# Patient Record
Sex: Female | Born: 1960 | Race: White | Hispanic: No | Smoking: Former smoker
Health system: Southern US, Community
[De-identification: ages and names within clinical notes are randomized; demographics above are authoritative.]

## PROBLEM LIST (undated history)

## (undated) DIAGNOSIS — F419 Anxiety disorder, unspecified: Secondary | ICD-10-CM

## (undated) HISTORY — PX: FACIAL COSMETIC SURGERY: SHX629

## (undated) HISTORY — PX: TONSILLECTOMY: SUR1361

---

## 2006-12-08 ENCOUNTER — Emergency Department: Payer: Self-pay | Admitting: Emergency Medicine

## 2008-04-13 ENCOUNTER — Emergency Department (HOSPITAL_COMMUNITY): Admission: EM | Admit: 2008-04-13 | Discharge: 2008-04-13 | Payer: Self-pay | Admitting: Emergency Medicine

## 2009-06-07 ENCOUNTER — Emergency Department: Payer: Self-pay | Admitting: Emergency Medicine

## 2009-08-08 ENCOUNTER — Emergency Department (HOSPITAL_COMMUNITY): Admission: EM | Admit: 2009-08-08 | Discharge: 2009-08-08 | Payer: Self-pay | Admitting: Emergency Medicine

## 2010-07-22 LAB — POCT I-STAT, CHEM 8
BUN: 7 mg/dL (ref 6–23)
Calcium, Ion: 1.1 mmol/L — ABNORMAL LOW (ref 1.12–1.32)
Chloride: 107 mEq/L (ref 96–112)
Creatinine, Ser: 0.8 mg/dL (ref 0.4–1.2)
HCT: 42 % (ref 36.0–46.0)
Potassium: 3.7 mEq/L (ref 3.5–5.1)

## 2010-07-22 LAB — CBC
HCT: 39.7 % (ref 36.0–46.0)
Hemoglobin: 13.7 g/dL (ref 12.0–15.0)
MCV: 92.2 fL (ref 78.0–100.0)
RDW: 14.4 % (ref 11.5–15.5)

## 2010-07-22 LAB — DIFFERENTIAL
Eosinophils Relative: 2 % (ref 0–5)
Lymphocytes Relative: 43 % (ref 12–46)
Lymphs Abs: 3 10*3/uL (ref 0.7–4.0)
Neutro Abs: 3.3 10*3/uL (ref 1.7–7.7)
Neutrophils Relative %: 47 % (ref 43–77)

## 2010-07-22 LAB — RAPID URINE DRUG SCREEN, HOSP PERFORMED
Amphetamines: NOT DETECTED
Barbiturates: NOT DETECTED
Benzodiazepines: POSITIVE — AB
Cocaine: NOT DETECTED
Tetrahydrocannabinol: POSITIVE — AB

## 2010-07-22 LAB — POCT PREGNANCY, URINE: Preg Test, Ur: NEGATIVE

## 2011-02-05 LAB — URINALYSIS, ROUTINE W REFLEX MICROSCOPIC
Specific Gravity, Urine: 1.024 (ref 1.005–1.030)
Urobilinogen, UA: 1 mg/dL (ref 0.0–1.0)

## 2011-02-05 LAB — URINE MICROSCOPIC-ADD ON

## 2011-02-05 LAB — CBC
HCT: 40.3 % (ref 36.0–46.0)
Hemoglobin: 13.5 g/dL (ref 12.0–15.0)
MCHC: 33.4 g/dL (ref 30.0–36.0)
MCV: 91.9 fL (ref 78.0–100.0)
Platelets: 266 10*3/uL (ref 150–400)
RBC: 4.39 MIL/uL (ref 3.87–5.11)
RDW: 13.7 % (ref 11.5–15.5)
WBC: 8.2 10*3/uL (ref 4.0–10.5)

## 2011-02-05 LAB — DIFFERENTIAL
Basophils Absolute: 0 10*3/uL (ref 0.0–0.1)
Eosinophils Relative: 1 % (ref 0–5)
Neutro Abs: 6.4 10*3/uL (ref 1.7–7.7)

## 2011-02-05 LAB — PREGNANCY, URINE: Preg Test, Ur: NEGATIVE

## 2017-05-06 ENCOUNTER — Emergency Department: Payer: Self-pay

## 2017-05-06 ENCOUNTER — Emergency Department
Admission: EM | Admit: 2017-05-06 | Discharge: 2017-05-06 | Disposition: A | Discharge status: Home / Self Care | Payer: Self-pay | Attending: Emergency Medicine | Admitting: Emergency Medicine

## 2017-05-06 DIAGNOSIS — W010XXA Fall on same level from slipping, tripping and stumbling without subsequent striking against object, initial encounter: Secondary | ICD-10-CM | POA: Insufficient documentation

## 2017-05-06 DIAGNOSIS — Y92009 Unspecified place in unspecified non-institutional (private) residence as the place of occurrence of the external cause: Secondary | ICD-10-CM | POA: Insufficient documentation

## 2017-05-06 DIAGNOSIS — Y939 Activity, unspecified: Secondary | ICD-10-CM | POA: Insufficient documentation

## 2017-05-06 DIAGNOSIS — S52532A Colles' fracture of left radius, initial encounter for closed fracture: Secondary | ICD-10-CM | POA: Insufficient documentation

## 2017-05-06 DIAGNOSIS — Z87891 Personal history of nicotine dependence: Secondary | ICD-10-CM | POA: Insufficient documentation

## 2017-05-06 DIAGNOSIS — S52602A Unspecified fracture of lower end of left ulna, initial encounter for closed fracture: Secondary | ICD-10-CM | POA: Insufficient documentation

## 2017-05-06 DIAGNOSIS — Y999 Unspecified external cause status: Secondary | ICD-10-CM | POA: Insufficient documentation

## 2017-05-06 MED ORDER — BUPIVACAINE HCL (PF) 0.25 % IJ SOLN
INTRAMUSCULAR | Status: AC
  Filled 2017-05-06: qty 30

## 2017-05-06 MED ORDER — HYDROCODONE-ACETAMINOPHEN 5-325 MG PO TABS: 1.0000 | ORAL_TABLET | Freq: Four times a day (QID) | ORAL | 0 refills | Status: DC | PRN

## 2017-05-06 MED ORDER — HYDROMORPHONE HCL 1 MG/ML IJ SOLN
INTRAMUSCULAR | Status: AC
  Filled 2017-05-06: qty 2

## 2017-05-06 MED ORDER — BUPIVACAINE HCL 0.5 % IJ SOLN
50.0000 mL | Freq: Once | INTRAMUSCULAR | Status: DC
  Filled 2017-05-06: qty 50

## 2017-05-06 MED ORDER — HYDROMORPHONE HCL 1 MG/ML IJ SOLN
2.0000 mg | Freq: Once | INTRAMUSCULAR | Status: AC
  Administered 2017-05-06: 2 mg via INTRAVENOUS

## 2017-05-06 NOTE — Discharge Instructions (Addendum)
Please keep your splint on at all times and follow-up with the orthopedic surgeon next week for reevaluation.  Return to the emergency department sooner for any concerns such as worsening pain, numbness, weakness, or for any other issues whatsoever.  It was a pleasure to take care of you today, and thank you for coming to our emergency department.  If you have any questions or concerns before leaving please ask the nurse to grab me and I'm more than happy to go through your aftercare instructions again.  If you were prescribed any opioid pain medication today such as Norco, Vicodin, Percocet, morphine, hydrocodone, or oxycodone please make sure you do not drive when you are taking this medication as it can alter your ability to drive safely.  If you have any concerns once you are home that you are not improving or are in fact getting worse before you can make it to your follow-up appointment, please do not hesitate to call 911 and come back for further evaluation.  Merrily BrittleNeil Jaelle Campanile, MD  Results for orders placed or performed during the hospital encounter of 08/08/09  Ethanol  Result Value Ref Range   Alcohol, Ethyl (B) (H) 0 - 10 mg/dL    191261        LOWEST DETECTABLE LIMIT FOR SERUM ALCOHOL IS 5 mg/dL FOR MEDICAL PURPOSES ONLY  CBC  Result Value Ref Range   WBC 7.0 4.0 - 10.5 K/uL   RBC 4.31 3.87 - 5.11 MIL/uL   Hemoglobin 13.7 12.0 - 15.0 g/dL   HCT 47.839.7 29.536.0 - 62.146.0 %   MCV 92.2 78.0 - 100.0 fL   MCHC 34.4 30.0 - 36.0 g/dL   RDW 30.814.4 65.711.5 - 84.615.5 %   Platelets 220 150 - 400 K/uL  Differential  Result Value Ref Range   Neutrophils Relative % 47 43 - 77 %   Neutro Abs 3.3 1.7 - 7.7 K/uL   Lymphocytes Relative 43 12 - 46 %   Lymphs Abs 3.0 0.7 - 4.0 K/uL   Monocytes Relative 8 3 - 12 %   Monocytes Absolute 0.5 0.1 - 1.0 K/uL   Eosinophils Relative 2 0 - 5 %   Eosinophils Absolute 0.2 0.0 - 0.7 K/uL   Basophils Relative 1 0 - 1 %   Basophils Absolute 0.0 0.0 - 0.1 K/uL  Drug screen  panel, emergency  Result Value Ref Range   Opiates NONE DETECTED NONE DETECTED   Cocaine NONE DETECTED NONE DETECTED   Benzodiazepines POSITIVE (A) NONE DETECTED   Amphetamines NONE DETECTED NONE DETECTED   Tetrahydrocannabinol POSITIVE (A) NONE DETECTED   Barbiturates  NONE DETECTED    NONE DETECTED        DRUG SCREEN FOR MEDICAL PURPOSES ONLY.  IF CONFIRMATION IS NEEDED FOR ANY PURPOSE, NOTIFY LAB WITHIN 5 DAYS.        LOWEST DETECTABLE LIMITS FOR URINE DRUG SCREEN Drug Class       Cutoff (ng/mL) Amphetamine      1000 Barbiturate      200 Benzodiazepine   200 Tricyclics       300 Opiates          300 Cocaine          300 THC              50  Pregnancy, urine POC  Result Value Ref Range   Preg Test, Ur      NEGATIVE        THE SENSITIVITY OF  THIS METHODOLOGY IS >24 mIU/mL  I-STAT, chem 8  Result Value Ref Range   Sodium 144 135 - 145 mEq/L   Potassium 3.7 3.5 - 5.1 mEq/L   Chloride 107 96 - 112 mEq/L   BUN 7 6 - 23 mg/dL   Creatinine, Ser 0.8 0.4 - 1.2 mg/dL   Glucose, Bld 96 70 - 99 mg/dL   Calcium, Ion 8.29 (L) 1.12 - 1.32 mmol/L   TCO2 23 0 - 100 mmol/L   Hemoglobin 14.3 12.0 - 15.0 g/dL   HCT 56.2 13.0 - 86.5 %  Sample to Blood Bank  Result Value Ref Range   Blood Bank Specimen SAMPLE AVAILABLE FOR TESTING    Sample Expiration 08/09/2009    Dg Wrist Complete Left  Result Date: 05/06/2017 CLINICAL DATA:  Mechanical fall 30 minutes ago. Left wrist injury and pain. Obvious deformity. EXAM: LEFT WRIST - COMPLETE 3+ VIEW COMPARISON:  None. FINDINGS: Transverse comminuted fractures of the distal left radial metaphysis. Fracture lines extend to the radiocarpal and radioulnar joint. There is dorsal displacement and angulation of the distal fracture fragments. Nondisplaced ulnar styloid process fracture. Degenerative changes in the wrist. Soft tissue swelling. IMPRESSION: Comminuted fractures of the distal left radial metaphysis. Fracture lines extending to the radiocarpal  and radioulnar joints. Dorsal displacement and angulation of distal fracture fragments. Ulnar styloid process fracture. Electronically Signed   By: Burman Nieves M.D.   On: 05/06/2017 02:31

## 2017-05-06 NOTE — ED Triage Notes (Signed)
Patient reports mechanical fall 30 minutes ago resulting in left wrist injury/pain. Patient reports she cannot lower arm to due increased wrist pain with lowered elevation.

## 2017-05-06 NOTE — ED Notes (Signed)
Pt fell at home and broke her wrist

## 2017-05-06 NOTE — ED Provider Notes (Signed)
[facility] Emergency Department Provider Note  ____________________________________________   First MD Initiated Contact with Patient 05/06/17 0225     (approximate)  I have reviewed the triage vital signs and the nursing notes.   HISTORY  Chief Complaint Wrist Injury    HPI Shannon Summers is a 57 y.o. female who self presents the emergency department with sudden onset severe left wrist pain that began when she slipped on dog urine while getting up to go to the bathroom and falling on her outstretched hand.  She did not hit her head.  She denies numbness or weakness.  The pain is worse with movement improved with rest.  History reviewed. No pertinent past medical history.  There are no active problems to display for this patient.   Past Surgical History:  Procedure Laterality Date  . TONSILLECTOMY      Prior to Admission medications   Medication Sig Start Date End Date Taking? Authorizing Provider  HYDROcodone-acetaminophen (NORCO) 5-325 MG tablet Take 1 tablet by mouth every 6 (six) hours as needed for up to 15 doses for severe pain. 05/06/17   Merrily Brittleifenbark, Kaydon Creedon, MD    Allergies Patient has no known allergies.  No family history on file.  Social History Social History   Tobacco Use  . Smoking status: Former Games developermoker  . Smokeless tobacco: Never Used  Substance Use Topics  . Alcohol use: Yes  . Drug use: No    Review of Systems Constitutional: No fever/chills ENT: No sore throat. Cardiovascular: Denies chest pain. Respiratory: Denies shortness of breath. Gastrointestinal: No abdominal pain.  No nausea, no vomiting.  No diarrhea.  No constipation. Musculoskeletal: Negative for back pain. Neurological: Negative for headaches   ____________________________________________   PHYSICAL EXAM:  VITAL SIGNS: ED Triage Vitals  Enc Vitals Group     BP 05/06/17 0153 134/67     Pulse Rate 05/06/17 0153 (!) 105     Resp 05/06/17 0153 20    Temp 05/06/17 0153 (!) 97.5 F (36.4 C)     Temp src --      SpO2 05/06/17 0153 96 %     Weight 05/06/17 0154 225 lb (102.1 kg)     Height --      Head Circumference --      Peak Flow --      Pain Score 05/06/17 0153 10     Pain Loc --      Pain Edu? --      Excl. in GC? --     Constitutional: Alert and oriented x4 appears uncomfortable nontoxic no diaphoresis speaks full clear sentences Head: Atraumatic. Nose: No congestion/rhinnorhea. Mouth/Throat: No trismus Neck: No stridor.   Cardiovascular: Regular rate and rhythm Respiratory: Normal respiratory effort.  No retractions. MSK: Exquisite tenderness over distal radius and ulna with dorsal angulation no tenderness over snuffbox and no axial load discomfort Sensation intact to light touch over first dorsal webspace, distal index finger, distal small finger Can flex and oppose  thumb, cross 2 on 3, and extend wrist 2+ radial pulse and less than 2 second capillary refill Skin is closed Compartments are soft  Neurologic:  Normal speech and language. No gross focal neurologic deficits are appreciated.  Skin:  Skin is warm, dry and intact. No rash noted.    ____________________________________________  LABS (all labs ordered are listed, but only abnormal results are displayed)  Labs Reviewed - No data to display   __________________________________________  EKG  ____________________________________________  RADIOLOGY  X-ray reviewed by me shows dorsally angulated distal radius fracture ____________________________________________   DIFFERENTIAL includes but not limited to  Colles' fracture, Barton's fracture, Smith fracture, wrist dislocation, wrist sprain   PROCEDURES  Procedure(s) performed: yes  SPLINT APPLICATION Date/Time: 4:48 AM Authorized by: Merrily Brittle Consent: Verbal consent obtained. Risks and benefits: risks, benefits and alternatives were discussed Consent given by: patient Splint  applied by: ER technician Location details: Left forearm Splint type: Sugar tong Supplies used: Ortho-Glass Post-procedure: The splinted body part was neurovascularly unchanged following the procedure. Patient tolerance: Patient tolerated the procedure well with no immediate complications.     Reduction of fracture Date/Time: 05/06/2017 4:46 AM Performed by: Merrily Brittle, MD Authorized by: Merrily Brittle, MD  Consent: Verbal consent obtained. Risks and benefits: risks, benefits and alternatives were discussed Consent given by: patient Patient understanding: patient states understanding of the procedure being performed Patient consent: the patient's understanding of the procedure matches consent given Procedure consent: procedure consent matches procedure scheduled Patient identity confirmed: verbally with patient and arm band Local anesthesia used: yes Anesthesia: hematoma block  Anesthesia: Local anesthesia used: yes Local Anesthetic: bupivacaine 0.5% without epinephrine  Sedation: Patient sedated: no  Patient tolerance: Patient tolerated the procedure well with no immediate complications  .Nerve Block Date/Time: 05/06/2017 4:46 AM Performed by: Merrily Brittle, MD Authorized by: Merrily Brittle, MD   Consent:    Consent obtained:  Verbal   Consent given by:  Patient   Risks discussed:  Allergic reaction, infection, nerve damage and pain   Alternatives discussed:  Alternative treatment and no treatment Indications:    Indications:  Pain relief Location:    Body area:  Upper extremity   Laterality:  Left Pre-procedure details:    Skin preparation:  Alcohol Skin anesthesia (see MAR for exact dosages):    Skin anesthesia method:  None Procedure details (see MAR for exact dosages):    Block needle gauge:  21 G   Anesthetic injected:  Bupivacaine 0.5% w/o epi   Steroid injected:  None   Additive injected:  None Post-procedure details:    Outcome:  Pain  relieved Comments:     I performed a hematoma block to the patient's left distal radius fracture using a 21-gauge needle and a total of 9 cc of 0.5% bupivacaine without epinephrine.  I got a flashback of blood and then infused the medication.  The patient achieved near complete anesthesia.    Critical Care performed: no  Observation: no ____________________________________________   INITIAL IMPRESSION / ASSESSMENT AND PLAN / ED COURSE  Pertinent labs & imaging results that were available during my care of the patient were reviewed by me and considered in my medical decision making (see chart for details).  The patient arrives with severe sudden onset distal radius pain after a fall on outstretched hand.  X-ray confirms a Colles' fracture.  I verbally consented the patient for hematoma block and achieved significant anesthesia.  I then reduced the fracture with distraction countertraction with successful reduction of the fracture.  She was then splinted and remained neurovascularly intact following.  Her pain is improved.  At this point she is medically stable for outpatient management with follow-up with orthopedic surgery in 1 week as scheduled.  She verbalized understanding agree with plan.      ____________________________________________   FINAL CLINICAL IMPRESSION(S) / ED DIAGNOSES  Final diagnoses:  Closed Colles' fracture of left radius, initial encounter  Closed fracture of distal end of left ulna, unspecified fracture morphology, initial  encounter      NEW MEDICATIONS STARTED DURING THIS VISIT:  This SmartLink is deprecated. Use AVSMEDLIST instead to display the medication list for a patient.   Note:  This document was prepared using Dragon voice recognition software and may include unintentional dictation errors.      Merrily Brittle, MD 05/06/17 [phone]

## 2017-06-07 ENCOUNTER — Other Ambulatory Visit: Payer: Self-pay

## 2017-06-07 ENCOUNTER — Encounter
Admission: RE | Admit: 2017-06-07 | Discharge: 2017-06-07 | Disposition: A | Discharge status: Home / Self Care | Payer: Self-pay | Source: Ambulatory Visit | Attending: Orthopedic Surgery | Admitting: Orthopedic Surgery

## 2017-06-07 HISTORY — DX: Anxiety disorder, unspecified: F41.9

## 2017-06-07 NOTE — Patient Instructions (Signed)
  Your procedure is scheduled on: 06-10-17  Report to Same Day Surgery 2nd floor medical mall [facility](Medical Mall Entrance-take elevator on left to 2nd floor.  Check in with surgery information desk.) To find out your arrival time please call [phone] between 1PM - 3PM on 06-09-17  Remember: Instructions that are not followed completely may result in serious medical risk, up to and including death, or upon the discretion of your surgeon and anesthesiologist your surgery may need to be rescheduled.    _x___ 1. Do not eat food after midnight the night before your procedure. NO GUM OR CANDY AFTER MIDNIGHT.  You may drink clear liquids up to 2 hours before you are scheduled to arrive at the hospital for your procedure.  Do not drink clear liquids within 2 hours of your scheduled arrival to the hospital.  Clear liquids include  --Water or Apple juice without pulp  --Clear carbohydrate beverage such as ClearFast or Gatorade  --Black Coffee or Clear Tea (No milk, no creamers, do not add anything to  the coffee or Tea      __x__ 2. No Alcohol for 24 hours before or after surgery.   __x__3. No Smoking for 24 prior to surgery.   ____  4. Bring all medications with you on the day of surgery if instructed.    __x__ 5. Notify your doctor if there is any change in your medical condition     (cold, fever, infections).     Do not wear jewelry, make-up, hairpins, clips or nail polish.  Do not wear lotions, powders, or perfumes. You may wear deodorant.  Do not shave 48 hours prior to surgery. Men may shave face and neck.  Do not bring valuables to the hospital.    [facility] not [facility] for any belongings or valuables.               Contacts, dentures or bridgework may not be worn into surgery.  Leave your suitcase in the car. After surgery it may be brought to your room.  For patients admitted to the hospital, discharge time is determined by your treatment team.   Patients discharged the day of  surgery will not be allowed to drive home.  You will need someone to drive you home and stay with you the night of your procedure.   _x___ TAKE THE FOLLOWING MEDICATION THE MORNING OF SURGERY WITH A SMALL SIP OF WATER. These include:  1. XANAX  2.  3.  4.  5.  6.  ____Fleets enema or Magnesium Citrate as directed.   ____ Use CHG Soap or sage wipes as directed on instruction sheet   ____ Use inhalers on the day of surgery and bring to hospital day of surgery  ____ Stop Metformin and Janumet 2 days prior to surgery.    ____ Take 1/2 of usual insulin dose the night before surgery and none on the morning surgery.   ____ Follow recommendations from Cardiologist, Pulmonologist or PCP regarding stopping Aspirin, Coumadin, Plavix ,Eliquis, Effient, or Pradaxa, and Pletal.  X____Stop Anti-inflammatories such as Advil, ALEVE, Ibuprofen, Motrin, Naproxen, Naprosyn, Goodies powders, BC POWDERS or aspirin products NOW-OK to take Tylenol    ____ Stop supplements until after surgery.     ____ Bring C-Pap to the hospital.

## 2017-06-09 MED ORDER — CEFAZOLIN SODIUM-DEXTROSE 2-4 GM/100ML-% IV SOLN
2.0000 g | INTRAVENOUS | Status: AC
  Administered 2017-06-10: 2 g via INTRAVENOUS

## 2017-06-09 NOTE — H&P (Signed)
HISTORY & PHYSICAL by Shari Heritage., MD at 06/07/2017 8:30 AM              Chief Complaint:     Chief Complaint  Patient presents with  . Wrist Pain    Left distal radius fracture1/4/19    Reason for Visit: The patient is a 57 y.o. right-hand dominant female who presents today for reevaluation of her left wrist. She fell on her outstretched hand and sustained a left distal radius fracture on 05/06/2017. Initial alignment was acceptable and she was treated with a long arm cast. However, follow-up radiographs at her last evaluation demonstrated significant loss of reduction. She had initially opted to defer any surgery, but subsequently reconsidered.  Medications: CurrentMedications        Current Outpatient Medications  Medication Sig Dispense Refill  . ALPRAZolam (XANAX) 1 MG tablet TAKE 1 TABLET 3 TIMES A DAY IF NEEDED  0   No current facility-administered medications for this visit.       Allergies: No Known Allergies  Past Medical History:     Past Medical History:  Diagnosis Date  . Chicken pox     Past Surgical History:      Past Surgical History:  Procedure Laterality Date  . TONSILLECTOMY      Social History: SocialHistory  Social History        Socioeconomic History  . Marital status: Divorced    Spouse name: Not on file  . Number of children: 2  . Years of education: 75  . Highest education level: Not on file  Social Needs  . Financial resource strain: Not on file  . Food insecurity - worry: Not on file  . Food insecurity - inability: Not on file  . Transportation needs - medical: Not on file  . Transportation needs - non-medical: Not on file  Occupational History  . Occupation: Full-time- Merchandiser, retail  Tobacco Use  . Smoking status: Former Smoker    Packs/day: 0.50    Years: 16.00    Pack years: 8.00    Types: Cigarettes    Last attempt to quit: 11/30/2016    Years since quitting:  0.5  . Smokeless tobacco: Never Used  Substance and Sexual Activity  . Alcohol use: Yes  . Drug use: Never  . Sexual activity: Defer    Partners: Male  Other Topics Concern  . Not on file  Social History Narrative  . Not on file      Family History:      Family History  Problem Relation Age of Onset  . Hodgkin's lymphoma Mother   . Heart disease Father   . No Known Problems Brother     Review of Systems: A comprehensive 14 point ROS was performed, reviewed, and the pertinent orthopaedic findings are documented in the HPI.  Exam BP 132/74   Ht 160 cm (5\' 3" )   Wt 89.5 kg (197 lb 6.4 oz)   BMI 34.97 kg/m   General:  Well-developed, well-nourished female seen in no acute distress.   HEENT:  Atraumatic, normocephalic.  Pupils are equal and reactive to light.  Extraocular motion is intact. Sclera are clear.  Oropharynx is clear with moist mucosa.  Neck: Good range of motion.  No tenderness to palpation. Spurling`s test is negative.  Lungs:  Clear to auscultation bilaterally.  Cardiovascular:  Regular rate and rhythm.  Normal S1, S2.  No murmur .  No appreciable gallops or rubs.  Peripheral pulses are palpable.    Extremities:  Normal shoulder contour.   Good range of motion and stability of the shoulders, elbows, and wrists. Tinel`s test at the elbow is negative.  Left wrist:        A short arm cast was intact. Tenderness:                Minimal Erythema:                    negative Swelling:                      negative Capillary Refill:            normal Thenar atrophy:          negative Intrinsic wasting:         negative Grip strength:              fair to good grip strength Pincer strength:           fair to good pincer strength Range of motion:        Good range of motion of the digits  Neurologic:  Awake, alert , and oriented.   Sensory function is intact to pinprick and light touch. Motor strength is judged to be 5/5 except as  noted above. No clonus or tremor.   Motor coordination is within normal limits.  Radiographs:  I reviewed the left wrist [facility]ographs [facility]. A comminuted fracture of the distal radius is noted with dorsal tilt and marked settling.   Impression: Left distal radius fracture  Plan:   The findings were discussed in detail with the patient. Conservative treatment options were reviewed with the patient.  We discussed the risks and benefits of surgical intervention.  The usual perioperative course was also discussed in detail.  The patient expressed understanding of the risks and benefits of surgical intervention and would like to proceed with plans for open reduction and internal fixation of the left distal radius fracture.  Due to the increased risk of perioperative complications associated with tobacco use, the patient is encouraged to stop smoking prior to surgery. (She admitted to smoking since the fracture.)  MEDICAL CLEARANCE: Per anesthesiology ACTIVITIES: As tolerated. WORK STATUS: Anticipate out of work for several days with limited use of the left upper extremity for 6-8 weeks.  THERAPY: None MEDICATIONS: Requested Prescriptions    No prescriptions requested or ordered in this encounter               FOLLOW-UP: Return for postoperative follow-up.   James P. Angie FavaHooten, Jr., M.D.       Electronically signed by Shari HeritageHooten, James Philmon Jr., MD on 06/09/2017 11:34 PM

## 2017-06-10 ENCOUNTER — Ambulatory Visit
Admission: RE | Admit: 2017-06-10 | Discharge: 2017-06-10 | Disposition: A | Discharge status: Home / Self Care | Payer: Self-pay | Source: Ambulatory Visit | Attending: Orthopedic Surgery | Admitting: Orthopedic Surgery

## 2017-06-10 ENCOUNTER — Other Ambulatory Visit: Payer: Self-pay

## 2017-06-10 ENCOUNTER — Ambulatory Visit: Payer: Self-pay | Admitting: Certified Registered Nurse Anesthetist

## 2017-06-10 ENCOUNTER — Encounter
Admission: RE | Disposition: A | Discharge status: Home / Self Care | Payer: Self-pay | Source: Ambulatory Visit | Attending: Orthopedic Surgery

## 2017-06-10 ENCOUNTER — Encounter: Payer: Self-pay | Admitting: Orthopedic Surgery

## 2017-06-10 DIAGNOSIS — S52515G Nondisplaced fracture of left radial styloid process, subsequent encounter for closed fracture with delayed healing: Secondary | ICD-10-CM | POA: Insufficient documentation

## 2017-06-10 DIAGNOSIS — F17210 Nicotine dependence, cigarettes, uncomplicated: Secondary | ICD-10-CM | POA: Insufficient documentation

## 2017-06-10 DIAGNOSIS — Z79899 Other long term (current) drug therapy: Secondary | ICD-10-CM | POA: Insufficient documentation

## 2017-06-10 DIAGNOSIS — E669 Obesity, unspecified: Secondary | ICD-10-CM | POA: Insufficient documentation

## 2017-06-10 DIAGNOSIS — W19XXXD Unspecified fall, subsequent encounter: Secondary | ICD-10-CM | POA: Insufficient documentation

## 2017-06-10 DIAGNOSIS — Z6834 Body mass index (BMI) 34.0-34.9, adult: Secondary | ICD-10-CM | POA: Insufficient documentation

## 2017-06-10 DIAGNOSIS — S52592G Other fractures of lower end of left radius, subsequent encounter for closed fracture with delayed healing: Secondary | ICD-10-CM | POA: Insufficient documentation

## 2017-06-10 DIAGNOSIS — F419 Anxiety disorder, unspecified: Secondary | ICD-10-CM | POA: Insufficient documentation

## 2017-06-10 HISTORY — PX: OPEN REDUCTION INTERNAL FIXATION (ORIF) DISTAL RADIAL FRACTURE: SHX5989

## 2017-06-10 SURGERY — OPEN REDUCTION INTERNAL FIXATION (ORIF) DISTAL RADIUS FRACTURE
Anesthesia: General | Site: Wrist | Laterality: Left | Wound class: Clean

## 2017-06-10 MED ORDER — ONDANSETRON HCL 4 MG/2ML IJ SOLN: 4.0000 mg | Freq: Once | INTRAMUSCULAR | Status: DC | PRN

## 2017-06-10 MED ORDER — KETOROLAC TROMETHAMINE 30 MG/ML IJ SOLN
INTRAMUSCULAR | Status: AC
Start: 2017-06-10 — End: ?
  Filled 2017-06-10: qty 1

## 2017-06-10 MED ORDER — ONDANSETRON HCL 4 MG PO TABS: 4.0000 mg | ORAL_TABLET | Freq: Four times a day (QID) | ORAL | Status: DC | PRN

## 2017-06-10 MED ORDER — FAMOTIDINE 20 MG PO TABS
20.0000 mg | ORAL_TABLET | Freq: Once | ORAL | Status: AC
  Administered 2017-06-10: 20 mg via ORAL

## 2017-06-10 MED ORDER — NEOMYCIN-POLYMYXIN B GU 40-200000 IR SOLN
Status: DC | PRN
  Administered 2017-06-10: 2 mL

## 2017-06-10 MED ORDER — MIDAZOLAM HCL 2 MG/2ML IJ SOLN
INTRAMUSCULAR | Status: DC | PRN
  Administered 2017-06-10: 2 mg via INTRAVENOUS

## 2017-06-10 MED ORDER — METOCLOPRAMIDE HCL 10 MG PO TABS: 5.0000 mg | ORAL_TABLET | Freq: Three times a day (TID) | ORAL | Status: DC | PRN

## 2017-06-10 MED ORDER — HYDROCODONE-ACETAMINOPHEN 5-325 MG PO TABS: 1.0000 | ORAL_TABLET | ORAL | 0 refills | Status: AC | PRN

## 2017-06-10 MED ORDER — ONDANSETRON HCL 4 MG/2ML IJ SOLN
INTRAMUSCULAR | Status: AC
Start: 2017-06-10 — End: ?
  Filled 2017-06-10: qty 2

## 2017-06-10 MED ORDER — PROPOFOL 10 MG/ML IV BOLUS
INTRAVENOUS | Status: AC
  Filled 2017-06-10: qty 20

## 2017-06-10 MED ORDER — FAMOTIDINE 20 MG PO TABS
ORAL_TABLET | ORAL | Status: AC
  Filled 2017-06-10: qty 1

## 2017-06-10 MED ORDER — GLYCOPYRROLATE 0.2 MG/ML IJ SOLN
INTRAMUSCULAR | Status: DC | PRN
  Administered 2017-06-10: 0.2 mg via INTRAVENOUS

## 2017-06-10 MED ORDER — LACTATED RINGERS IV SOLN
INTRAVENOUS | Status: DC
  Administered 2017-06-10: 10:00:00 via INTRAVENOUS

## 2017-06-10 MED ORDER — ONDANSETRON HCL 4 MG/2ML IJ SOLN: 4.0000 mg | Freq: Four times a day (QID) | INTRAMUSCULAR | Status: DC | PRN

## 2017-06-10 MED ORDER — FENTANYL CITRATE (PF) 100 MCG/2ML IJ SOLN
INTRAMUSCULAR | Status: DC | PRN
  Administered 2017-06-10 (×7): 25 ug via INTRAVENOUS
  Administered 2017-06-10: 50 ug via INTRAVENOUS
  Administered 2017-06-10 (×3): 25 ug via INTRAVENOUS

## 2017-06-10 MED ORDER — FENTANYL CITRATE (PF) 100 MCG/2ML IJ SOLN
INTRAMUSCULAR | Status: AC
  Filled 2017-06-10: qty 2

## 2017-06-10 MED ORDER — ONDANSETRON HCL 4 MG/2ML IJ SOLN
INTRAMUSCULAR | Status: DC | PRN
  Administered 2017-06-10: 4 mg via INTRAVENOUS

## 2017-06-10 MED ORDER — MIDAZOLAM HCL 2 MG/2ML IJ SOLN
INTRAMUSCULAR | Status: AC
  Filled 2017-06-10: qty 2

## 2017-06-10 MED ORDER — DEXAMETHASONE SODIUM PHOSPHATE 10 MG/ML IJ SOLN
INTRAMUSCULAR | Status: DC | PRN
  Administered 2017-06-10: 10 mg via INTRAVENOUS

## 2017-06-10 MED ORDER — ACETAMINOPHEN 10 MG/ML IV SOLN
INTRAVENOUS | Status: AC
  Filled 2017-06-10: qty 100

## 2017-06-10 MED ORDER — LIDOCAINE HCL (CARDIAC) 20 MG/ML IV SOLN
INTRAVENOUS | Status: DC | PRN
  Administered 2017-06-10: 100 mg via INTRAVENOUS

## 2017-06-10 MED ORDER — PROPOFOL 10 MG/ML IV BOLUS
INTRAVENOUS | Status: DC | PRN
  Administered 2017-06-10: 130 mg via INTRAVENOUS

## 2017-06-10 MED ORDER — CHLORHEXIDINE GLUCONATE 4 % EX LIQD: 60.0000 mL | Freq: Once | CUTANEOUS | Status: DC

## 2017-06-10 MED ORDER — LIDOCAINE HCL (PF) 2 % IJ SOLN
INTRAMUSCULAR | Status: AC
  Filled 2017-06-10: qty 10

## 2017-06-10 MED ORDER — BUPIVACAINE HCL (PF) 0.25 % IJ SOLN
INTRAMUSCULAR | Status: DC | PRN
  Administered 2017-06-10: 10 mL

## 2017-06-10 MED ORDER — DEXAMETHASONE SODIUM PHOSPHATE 10 MG/ML IJ SOLN
INTRAMUSCULAR | Status: AC
  Filled 2017-06-10: qty 1

## 2017-06-10 MED ORDER — CEFAZOLIN SODIUM-DEXTROSE 2-4 GM/100ML-% IV SOLN
INTRAVENOUS | Status: AC
  Filled 2017-06-10: qty 100

## 2017-06-10 MED ORDER — FENTANYL CITRATE (PF) 100 MCG/2ML IJ SOLN
INTRAMUSCULAR | Status: AC
  Administered 2017-06-10: 25 ug via INTRAVENOUS
  Filled 2017-06-10: qty 2

## 2017-06-10 MED ORDER — BUPIVACAINE HCL (PF) 0.25 % IJ SOLN
INTRAMUSCULAR | Status: AC
  Filled 2017-06-10: qty 30

## 2017-06-10 MED ORDER — ACETAMINOPHEN 10 MG/ML IV SOLN
INTRAVENOUS | Status: DC | PRN
  Administered 2017-06-10: 1000 mg via INTRAVENOUS

## 2017-06-10 MED ORDER — NEOMYCIN-POLYMYXIN B GU 40-200000 IR SOLN
Status: AC
  Filled 2017-06-10: qty 2

## 2017-06-10 MED ORDER — FENTANYL CITRATE (PF) 100 MCG/2ML IJ SOLN
25.0000 ug | INTRAMUSCULAR | Status: AC | PRN
  Administered 2017-06-10 (×6): 25 ug via INTRAVENOUS

## 2017-06-10 MED ORDER — METOCLOPRAMIDE HCL 5 MG/ML IJ SOLN: 5.0000 mg | Freq: Three times a day (TID) | INTRAMUSCULAR | Status: DC | PRN

## 2017-06-10 SURGICAL SUPPLY — 52 items
BANDAGE ACE 3X5.8 VEL STRL LF (GAUZE/BANDAGES/DRESSINGS) ×3 IMPLANT
BIT DRILL 2 FAST STEP (BIT) ×3 IMPLANT
BIT DRILL 2.5X4 QC (BIT) ×3 IMPLANT
BNDG ESMARK 4X12 TAN STRL LF (GAUZE/BANDAGES/DRESSINGS) ×3 IMPLANT
BRUSH SCRUB EZ  4% CHG (MISCELLANEOUS) ×2
BRUSH SCRUB EZ 4% CHG (MISCELLANEOUS) ×1 IMPLANT
CAST PADDING 3X4FT ST 30246 (SOFTGOODS) ×2
CLOSURE WOUND 1/4X4 (GAUZE/BANDAGES/DRESSINGS) ×1
CUFF TOURN 18 STER (MISCELLANEOUS) ×3 IMPLANT
DRAPE C-ARM XRAY 36X54 (DRAPES) ×3 IMPLANT
DRAPE FLUOR MINI C-ARM 54X84 (DRAPES) ×3 IMPLANT
DRSG DERMACEA 8X12 NADH (GAUZE/BANDAGES/DRESSINGS) ×3 IMPLANT
DRSG TELFA 3X8 NADH (GAUZE/BANDAGES/DRESSINGS) ×3 IMPLANT
DURAPREP 26ML APPLICATOR (WOUND CARE) ×6 IMPLANT
ELECT REM PT RETURN 9FT ADLT (ELECTROSURGICAL) ×3
ELECTRODE REM PT RTRN 9FT ADLT (ELECTROSURGICAL) ×1 IMPLANT
GAUZE SPONGE 4X4 12PLY STRL (GAUZE/BANDAGES/DRESSINGS) ×3 IMPLANT
GLOVE BIOGEL M STRL SZ7.5 (GLOVE) ×3 IMPLANT
GLOVE BIOGEL PI IND STRL 7.0 (GLOVE) ×5 IMPLANT
GLOVE BIOGEL PI INDICATOR 7.0 (GLOVE) ×10
GLOVE INDICATOR 8.0 STRL GRN (GLOVE) ×3 IMPLANT
GOWN STRL REUS W/ TWL LRG LVL3 (GOWN DISPOSABLE) ×3 IMPLANT
GOWN STRL REUS W/TWL LRG LVL3 (GOWN DISPOSABLE) ×6
K-WIRE 1.6 (WIRE) ×4
K-WIRE FX5X1.6XNS BN SS (WIRE) ×2
KIT TURNOVER KIT A (KITS) ×3 IMPLANT
KWIRE FX5X1.6XNS BN SS (WIRE) ×2 IMPLANT
NEEDLE FILTER BLUNT 18X 1/2SAF (NEEDLE) ×2
NEEDLE FILTER BLUNT 18X1 1/2 (NEEDLE) ×1 IMPLANT
NS IRRIG 500ML POUR BTL (IV SOLUTION) ×3 IMPLANT
PACK EXTREMITY ARMC (MISCELLANEOUS) ×3 IMPLANT
PAD CAST CTTN 3X4 STRL (SOFTGOODS) ×1 IMPLANT
PADDING CAST BLEND 3X4 NS (MISCELLANEOUS) ×1 IMPLANT
PADDING CAST BLEND 3X4YD NS (MISCELLANEOUS) ×2
PLATE SHORT 24.4X51.3 LT (Plate) ×3 IMPLANT
SCREW BN 12X3.5XNS CORT TI (Screw) ×3 IMPLANT
SCREW CORT 3.5X12 (Screw) ×6 IMPLANT
SCREW PEG LOCK 2.5X16 (Peg) ×6 IMPLANT
SCREW PEG LOCK 2.5X18 (Peg) ×9 IMPLANT
SCREW PEG LOCK 2.5X20 (Peg) ×6 IMPLANT
SOL PREP PVP 2OZ (MISCELLANEOUS) ×3
SOLUTION PREP PVP 2OZ (MISCELLANEOUS) ×1 IMPLANT
SPLINT CAST 1 STEP 3X12 (MISCELLANEOUS) ×3 IMPLANT
STOCKINETTE STRL 4IN 9604848 (GAUZE/BANDAGES/DRESSINGS) ×3 IMPLANT
STRIP CLOSURE SKIN 1/4X4 (GAUZE/BANDAGES/DRESSINGS) ×2 IMPLANT
SUT ETHILON 5-0 FS-2 18 BLK (SUTURE) ×3 IMPLANT
SUT VIC AB 2-0 SH 27 (SUTURE) ×2
SUT VIC AB 2-0 SH 27XBRD (SUTURE) ×1 IMPLANT
SUT VIC AB 4-0 FS2 27 (SUTURE) ×3 IMPLANT
SYR 5ML LL (SYRINGE) ×3 IMPLANT
WIRE Z .045 C-WIRE SPADE TIP (WIRE) IMPLANT
WIRE Z .062 C-WIRE SPADE TIP (WIRE) IMPLANT

## 2017-06-10 NOTE — OR Nursing (Signed)
Incentive spirometer explained with return demonstration 

## 2017-06-10 NOTE — Discharge Instructions (Signed)
°  Instructions after Hand / Wrist Surgery ° ° James P. Hooten, Jr., M.D. ° Dept. of Orthopaedics & Sports Medicine ° [facility] ° [address] ° [place],   [address] ° ° Phone: [phone]   Fax: [phone] ° ° °DIET: °• Drink plenty of non-alcoholic fluids & begin a light diet. °• Resume your normal diet the day after surgery. ° °ACTIVITY:  °• Keep the hand elevated above the level of the elbow. °• Begin gently moving the fingers on a regular basis to avoid stiffness. °• Avoid any heavy lifting, pushing, or pulling with the operative hand. °• Do not drive or operate any equipment until instructed. ° °WOUND CARE:  °• Keep the splint/bandage clean and dry.  °• The splint and stitches will be removed in the office. °• Continue to use the ice packs periodically to reduce pain and swelling. °• You may bathe or shower after the stitches are removed at the first office visit following surgery. ° °MEDICATIONS: °• You may resume your regular medications. °• Please take the pain medication as prescribed. °• Do not take pain medication on an empty stomach. °• Do not drive or drink alcoholic beverages when taking pain medications. ° °CALL THE OFFICE FOR: °• Temperature above 101 degrees °• Excessive bleeding or drainage on the dressing. °• Excessive swelling, coldness, or paleness of the fingers. °• Persistent nausea and vomiting. ° °FOLLOW-UP:  °• You should have an appointment to return to the office in 7-10 days after surgery.  ° °REMEMBER: R.I.C.E. = Rest, Ice, Compression, Elevation !  ° ° ° °AMBULATORY SURGERY  °DISCHARGE INSTRUCTIONS ° ° °1) The drugs that you were given will stay in your system until tomorrow so for the next 24 hours you should not: ° °A) Drive an automobile °B) Make any legal decisions °C) Drink any alcoholic beverage ° ° °2) You may resume regular meals tomorrow.  Today it is better to start with liquids and gradually work up to solid foods. ° °You may eat anything you prefer, but  it is better to start with liquids, then soup and crackers, and gradually work up to solid foods. ° ° °3) Please notify your doctor immediately if you have any unusual bleeding, trouble breathing, redness and pain at the surgery site, drainage, fever, or pain not relieved by medication. ° ° ° °4) Additional Instructions: ° ° ° ° ° ° ° °Please contact your physician with any problems or Same Day Surgery at [phone], Monday through Friday 6 am to 4 pm, or [place] [facility] [place] [facility] number at [phone]. °

## 2017-06-10 NOTE — Anesthesia Preprocedure Evaluation (Signed)
Anesthesia Evaluation  Patient identified by MRN, date of birth, ID band Patient awake    Reviewed: Allergy & Precautions, NPO status , Patient's Chart, lab work & pertinent test results, reviewed documented beta blocker date and time   Airway Mallampati: III  TM Distance: >3 FB     Dental  (+) Chipped   Pulmonary former smoker,           Cardiovascular      Neuro/Psych Anxiety    GI/Hepatic   Endo/Other    Renal/GU      Musculoskeletal   Abdominal   Peds  Hematology   Anesthesia Other Findings Obese.  Reproductive/Obstetrics                             Anesthesia Physical Anesthesia Plan  ASA: III  Anesthesia Plan: General   Post-op Pain Management:    Induction: Intravenous  PONV Risk Score and Plan:   Airway Management Planned: LMA  Additional Equipment:   Intra-op Plan:   Post-operative Plan:   Informed Consent: I have reviewed the patients History and Physical, chart, labs and discussed the procedure including the risks, benefits and alternatives for the proposed anesthesia with the patient or authorized representative who has indicated his/her understanding and acceptance.     Plan Discussed with: CRNA  Anesthesia Plan Comments:         Anesthesia Quick Evaluation

## 2017-06-10 NOTE — Op Note (Signed)
OPERATIVE NOTE  DATE OF SURGERY:  06/10/2017  PATIENT NAME:  Shannon Summers   DOB: 08/08/1960  MRN: [id number]  PRE-OPERATIVE DIAGNOSIS: Left distal radius fracture  POST-OPERATIVE DIAGNOSIS:  Same  PROCEDURE:  Open reduction and internal fixation of the left distal radius fracture  SURGEON:  Jena GaussJames P Hooten, Jr. M.D.  ANESTHESIA: general  ESTIMATED BLOOD LOSS: Minimal  FLUIDS REPLACED: 800 mL of crystalloid  TOURNIQUET TIME: 90 minutes  DRAINS: None  IMPLANTS UTILIZED: [employer] DVRA short volar plate, 7 - 0.8MV2.5mm threaded pegs, 3 - 3.85mm cortical screws  INDICATIONS FOR SURGERY: Shannon Summers is a 57 y.o. year old female who sustained a left distal radius fracture.  Initial radiographs demonstrated good reduction, although the last follow-up radiograph demonstrated loss of reduction with evidence of pending malunion.  After discussion of the risks and benefits of surgical intervention, the patient expressed understanding of the risks benefits and agree with plans for open reduction and internal fixation.   PROCEDURE IN DETAIL: The patient was brought into the operating room and after adequate general anesthesia, a tourniquet was placed on the patient's left upper arm.The left hand and arm were prepped with alcohol and Duraprep and draped in the usual sterile fashion. A "time-out" was performed as per usual protocol. The hand and forearm were exsanguinated using an Esmarch and the tourniquet was inflated to 250 mmHg. Loupe magnification was used throughout the procedure. A volar longitudinal incision was made in line with the flexor carpi radialis tendon. The FCR tendon was reflected in a radial fashion and the floor of the tendon sheath was incised. Dissection was carried down to the pronator quadratus. The pronator quadratus was incised in an L-shaped fashion and elevated off of the volar surface of the distal radius. The fracture site was visualized and the bony callus was debrided  and the distal fragment elevated using osteotomes.  The fracture was reduced. A [employer] DVRA short volar plate was provisionally positioned and stabilized using K wires. Reduction of the fracture and position of the implant were visualized in multiple planes using the FluoroScan. A 3.5 mm cortical screw was placed in the slotted position. Next, 4 fully threaded 2.5 mm threaded pegs were placed in the proximal row with position confirmed using the FluoroScan. In a similar fashion, 3 fully threaded 2.5 mm threaded pegs were placed in the distal row. Finally, the proximal portion of the plate was additionally stabilized with 2 additional 3.5 mm cortical screws. The wound was irrigated with copious amounts of normal saline with antibiotic solution. The pronator quadratus was repaired over the plate using #7-8#2-0 Vicryl. Tourniquet was deflated after total tourniquet time of 90 minutes. Good hemostasis was appreciated. The subcutaneous tissue was approximated with interrupted sutures of #2-0 Vicryl. The skin was closed with a running subcuticular suture of #4-0 Vicryl. Steri-Strips were applied. 0.25% Marcaine was injected along the incision site. A sterile dressing was applied followed by application of a volar splint.   The patient tolerated the procedure well and was transported to the PACU in stable condition.  James P. Angie FavaHooten, Jr., M.D.

## 2017-06-10 NOTE — Transfer of Care (Signed)
Immediate Anesthesia Transfer of Care Note  Patient: Shannon Summers  Procedure(s) Performed: OPEN REDUCTION INTERNAL FIXATION (ORIF) DISTAL RADIAL FRACTURE (Left Wrist)  Patient Location: PACU  Anesthesia Type:General  Level of Consciousness: awake, alert , oriented and patient cooperative  Airway & Oxygen Therapy: Patient Spontanous Breathing and Patient connected to face mask oxygen  Post-op Assessment: Report given to RN and Post -op Vital signs reviewed and stable  Post vital signs: Reviewed and stable  Last Vitals:  Vitals:   06/10/17 1018  BP: 129/76  Pulse: 91  Resp: 18  Temp: 36.7 C  SpO2: 100%    Last Pain:  Vitals:   06/10/17 1018  TempSrc: Temporal         Complications: No apparent anesthesia complications

## 2017-06-10 NOTE — Anesthesia Post-op Follow-up Note (Signed)
Anesthesia QCDR form completed.        

## 2017-06-10 NOTE — Anesthesia Postprocedure Evaluation (Signed)
Anesthesia Post Note  Patient: Shannon Summers  Procedure(s) Performed: OPEN REDUCTION INTERNAL FIXATION (ORIF) DISTAL RADIAL FRACTURE (Left Wrist)  Patient location during evaluation: PACU Anesthesia Type: General Level of consciousness: awake and alert Pain management: pain level controlled Vital Signs Assessment: post-procedure vital signs reviewed and stable Respiratory status: spontaneous breathing, nonlabored ventilation, respiratory function stable and patient connected to nasal cannula oxygen Cardiovascular status: blood pressure returned to baseline and stable Postop Assessment: no apparent nausea or vomiting Anesthetic complications: no     Last Vitals:  Vitals:   06/10/17 1523 06/10/17 1537  BP: 116/87 128/69  Pulse: 91 85  Resp: 19 17  Temp: 37.2 C 36.4 C  SpO2: 93% 94%    Last Pain:  Vitals:   06/10/17 1537  TempSrc: Temporal  PainSc: 6                  Adriaan Maltese S

## 2017-06-10 NOTE — Anesthesia Procedure Notes (Signed)
Procedure Name: LMA Insertion Date/Time: 06/10/2017 11:58 AM Performed by: Dava NajjarFrazier, Holdan Stucke, CRNA Pre-anesthesia Checklist: Patient identified, Emergency Drugs available, Suction available, Patient being monitored and Timeout performed Patient Re-evaluated:Patient Re-evaluated prior to induction Oxygen Delivery Method: Circle system utilized Preoxygenation: Pre-oxygenation with 100% oxygen Induction Type: IV induction Ventilation: Mask ventilation without difficulty LMA: LMA inserted LMA Size: 4.0 Number of attempts: 1 Placement Confirmation: positive ETCO2 and breath sounds checked- equal and bilateral Tube secured with: Tape Dental Injury: Teeth and Oropharynx as per pre-operative assessment

## 2017-06-10 NOTE — H&P (Signed)
The patient has been re-examined, and the chart reviewed, and there have been no interval changes to the documented history and physical.    The risks, benefits, and alternatives have been discussed at length. The patient expressed understanding of the risks benefits and agreed with plans for surgical intervention.  Shannon Summers P. Paitlyn Mcclatchey, Jr. M.D.    

## 2017-06-13 ENCOUNTER — Encounter: Payer: Self-pay | Admitting: Orthopedic Surgery

## 2017-07-06 ENCOUNTER — Encounter: Payer: Self-pay | Admitting: Occupational Therapy

## 2017-07-06 ENCOUNTER — Ambulatory Visit: Payer: No Typology Code available for payment source | Attending: Student | Admitting: Occupational Therapy

## 2017-07-06 ENCOUNTER — Other Ambulatory Visit: Payer: Self-pay

## 2017-07-06 DIAGNOSIS — M25632 Stiffness of left wrist, not elsewhere classified: Secondary | ICD-10-CM | POA: Insufficient documentation

## 2017-07-06 DIAGNOSIS — R600 Localized edema: Secondary | ICD-10-CM | POA: Insufficient documentation

## 2017-07-06 DIAGNOSIS — M25532 Pain in left wrist: Secondary | ICD-10-CM | POA: Insufficient documentation

## 2017-07-06 DIAGNOSIS — M6281 Muscle weakness (generalized): Secondary | ICD-10-CM | POA: Insufficient documentation

## 2017-07-06 DIAGNOSIS — M25642 Stiffness of left hand, not elsewhere classified: Secondary | ICD-10-CM | POA: Insufficient documentation

## 2017-07-06 DIAGNOSIS — L905 Scar conditions and fibrosis of skin: Secondary | ICD-10-CM | POA: Insufficient documentation
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
## 2017-07-06 NOTE — Patient Instructions (Signed)
Contrast  Scar massage and soft tissue mobs   cica scar pad use at night time  AROM thumb PA , RA  Opposition to base of 5th sliding  Tendon glides  Tapping of diigts for extention   AROM and AAROM gentle for wrist flexion , ext  AROM RD, UD  AROM sup .pron 10 reps   2-3 x day

## 2017-07-11 ENCOUNTER — Ambulatory Visit: Payer: No Typology Code available for payment source | Admitting: Occupational Therapy

## 2017-07-11 DIAGNOSIS — M25532 Pain in left wrist: Secondary | ICD-10-CM

## 2017-07-11 DIAGNOSIS — L905 Scar conditions and fibrosis of skin: Secondary | ICD-10-CM

## 2017-07-11 DIAGNOSIS — M6281 Muscle weakness (generalized): Secondary | ICD-10-CM

## 2017-07-11 DIAGNOSIS — R600 Localized edema: Secondary | ICD-10-CM

## 2017-07-11 DIAGNOSIS — M25632 Stiffness of left wrist, not elsewhere classified: Secondary | ICD-10-CM

## 2017-07-11 DIAGNOSIS — M25642 Stiffness of left hand, not elsewhere classified: Secondary | ICD-10-CM
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
## 2017-07-11 NOTE — Patient Instructions (Addendum)
Same HEP  Can help if pain free _AAROM for sup , wrist ext, flexion  And massage for edema distal to prox over MC's of hand and forearm

## 2017-07-13 ENCOUNTER — Ambulatory Visit: Payer: No Typology Code available for payment source | Admitting: Occupational Therapy

## 2017-07-13 DIAGNOSIS — M6281 Muscle weakness (generalized): Secondary | ICD-10-CM

## 2017-07-13 DIAGNOSIS — R600 Localized edema: Secondary | ICD-10-CM

## 2017-07-13 DIAGNOSIS — L905 Scar conditions and fibrosis of skin: Secondary | ICD-10-CM

## 2017-07-13 DIAGNOSIS — M25642 Stiffness of left hand, not elsewhere classified: Secondary | ICD-10-CM

## 2017-07-13 DIAGNOSIS — M25532 Pain in left wrist: Secondary | ICD-10-CM

## 2017-07-13 DIAGNOSIS — M25632 Stiffness of left wrist, not elsewhere classified: Secondary | ICD-10-CM

## 2017-07-13 NOTE — Patient Instructions (Signed)
Same HEP  
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
## 2017-07-18 ENCOUNTER — Ambulatory Visit: Payer: No Typology Code available for payment source | Admitting: Occupational Therapy

## 2017-07-18 DIAGNOSIS — R600 Localized edema: Secondary | ICD-10-CM

## 2017-07-18 DIAGNOSIS — M25632 Stiffness of left wrist, not elsewhere classified: Secondary | ICD-10-CM

## 2017-07-18 DIAGNOSIS — M25642 Stiffness of left hand, not elsewhere classified: Secondary | ICD-10-CM

## 2017-07-18 DIAGNOSIS — M6281 Muscle weakness (generalized): Secondary | ICD-10-CM

## 2017-07-18 DIAGNOSIS — L905 Scar conditions and fibrosis of skin: Secondary | ICD-10-CM

## 2017-07-18 DIAGNOSIS — M25532 Pain in left wrist: Secondary | ICD-10-CM

## 2017-07-18 NOTE — Patient Instructions (Signed)
ADD PROM for wrist ext, flexion , RD ,UD over edge of table   10 reps  Prior to AROM  And after contrast   keep pain under 2/10
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
## 2017-07-21 ENCOUNTER — Ambulatory Visit: Payer: No Typology Code available for payment source | Admitting: Occupational Therapy

## 2017-07-21 DIAGNOSIS — M25642 Stiffness of left hand, not elsewhere classified: Secondary | ICD-10-CM

## 2017-07-21 DIAGNOSIS — R600 Localized edema: Secondary | ICD-10-CM

## 2017-07-21 DIAGNOSIS — M25532 Pain in left wrist: Secondary | ICD-10-CM

## 2017-07-21 DIAGNOSIS — M6281 Muscle weakness (generalized): Secondary | ICD-10-CM

## 2017-07-21 DIAGNOSIS — L905 Scar conditions and fibrosis of skin: Secondary | ICD-10-CM

## 2017-07-21 DIAGNOSIS — M25632 Stiffness of left wrist, not elsewhere classified: Secondary | ICD-10-CM

## 2017-07-21 NOTE — Patient Instructions (Signed)
Upgrade HEP to 1 lbs for wrist in all planes  2 x day  12 reps  Cont same for massage, contrast , tendon glides  And PROM
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
## 2017-07-25 ENCOUNTER — Ambulatory Visit: Payer: No Typology Code available for payment source | Admitting: Occupational Therapy

## 2017-07-25 DIAGNOSIS — M6281 Muscle weakness (generalized): Secondary | ICD-10-CM

## 2017-07-25 DIAGNOSIS — R600 Localized edema: Secondary | ICD-10-CM

## 2017-07-25 DIAGNOSIS — L905 Scar conditions and fibrosis of skin: Secondary | ICD-10-CM

## 2017-07-25 DIAGNOSIS — M25642 Stiffness of left hand, not elsewhere classified: Secondary | ICD-10-CM

## 2017-07-25 DIAGNOSIS — M25632 Stiffness of left wrist, not elsewhere classified: Secondary | ICD-10-CM

## 2017-07-25 DIAGNOSIS — M25532 Pain in left wrist: Secondary | ICD-10-CM
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
## 2017-07-28 ENCOUNTER — Ambulatory Visit: Payer: No Typology Code available for payment source | Admitting: Occupational Therapy

## 2017-08-01 ENCOUNTER — Ambulatory Visit: Payer: No Typology Code available for payment source | Attending: Student | Admitting: Occupational Therapy

## 2017-08-01 DIAGNOSIS — M25532 Pain in left wrist: Secondary | ICD-10-CM | POA: Insufficient documentation

## 2017-08-01 DIAGNOSIS — M25632 Stiffness of left wrist, not elsewhere classified: Secondary | ICD-10-CM | POA: Insufficient documentation

## 2017-08-01 DIAGNOSIS — L905 Scar conditions and fibrosis of skin: Secondary | ICD-10-CM | POA: Insufficient documentation

## 2017-08-01 DIAGNOSIS — M25642 Stiffness of left hand, not elsewhere classified: Secondary | ICD-10-CM | POA: Insufficient documentation

## 2017-08-01 DIAGNOSIS — R600 Localized edema: Secondary | ICD-10-CM

## 2017-08-01 DIAGNOSIS — M6281 Muscle weakness (generalized): Secondary | ICD-10-CM | POA: Insufficient documentation
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
## 2017-08-01 NOTE — Patient Instructions (Signed)
Add light blue putty for grip , lat and 3 point grip  2 x 12 reps if no increase pain   2-3 sets with 1 lbs for wrist in all planes after PROM

## 2017-08-03 ENCOUNTER — Ambulatory Visit: Payer: No Typology Code available for payment source | Admitting: Occupational Therapy

## 2017-08-03 DIAGNOSIS — M25632 Stiffness of left wrist, not elsewhere classified: Secondary | ICD-10-CM

## 2017-08-03 DIAGNOSIS — M6281 Muscle weakness (generalized): Secondary | ICD-10-CM

## 2017-08-03 DIAGNOSIS — L905 Scar conditions and fibrosis of skin: Secondary | ICD-10-CM

## 2017-08-03 DIAGNOSIS — M25532 Pain in left wrist: Secondary | ICD-10-CM

## 2017-08-03 DIAGNOSIS — R600 Localized edema: Secondary | ICD-10-CM

## 2017-08-03 DIAGNOSIS — M25642 Stiffness of left hand, not elsewhere classified: Secondary | ICD-10-CM

## 2017-08-03 NOTE — Patient Instructions (Signed)
Increase to 2-3 sets of 1 lbs for wrist extention , flexion , UD , Rd  And 16 oz hammer for sup/pro Pain free
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
## 2017-08-08 ENCOUNTER — Encounter: Payer: No Typology Code available for payment source | Admitting: Occupational Therapy

## 2017-08-10 ENCOUNTER — Ambulatory Visit: Payer: No Typology Code available for payment source | Admitting: Occupational Therapy

## 2017-08-10 DIAGNOSIS — L905 Scar conditions and fibrosis of skin: Secondary | ICD-10-CM

## 2017-08-10 DIAGNOSIS — M25532 Pain in left wrist: Secondary | ICD-10-CM

## 2017-08-10 DIAGNOSIS — M6281 Muscle weakness (generalized): Secondary | ICD-10-CM

## 2017-08-10 DIAGNOSIS — M25632 Stiffness of left wrist, not elsewhere classified: Secondary | ICD-10-CM

## 2017-08-10 DIAGNOSIS — M25642 Stiffness of left hand, not elsewhere classified: Secondary | ICD-10-CM

## 2017-08-10 DIAGNOSIS — R600 Localized edema: Secondary | ICD-10-CM
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
## 2017-08-10 NOTE — Patient Instructions (Signed)
Pt to focus on wrist flexion , ext PROM  And hammer for sup and pronation - relax into stretch   Putty teal for gripping , and then into ball of putty for lat and 3 point grip  12 reps   2 x day   Pain free  scar massage cont with and new cica scar pad

## 2017-08-15 ENCOUNTER — Ambulatory Visit: Payer: No Typology Code available for payment source | Admitting: Occupational Therapy

## 2017-08-15 DIAGNOSIS — R600 Localized edema: Secondary | ICD-10-CM

## 2017-08-15 DIAGNOSIS — M6281 Muscle weakness (generalized): Secondary | ICD-10-CM

## 2017-08-15 DIAGNOSIS — M25642 Stiffness of left hand, not elsewhere classified: Secondary | ICD-10-CM

## 2017-08-15 DIAGNOSIS — M25532 Pain in left wrist: Secondary | ICD-10-CM

## 2017-08-15 DIAGNOSIS — L905 Scar conditions and fibrosis of skin: Secondary | ICD-10-CM

## 2017-08-15 DIAGNOSIS — M25632 Stiffness of left wrist, not elsewhere classified: Secondary | ICD-10-CM
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
## 2017-08-15 NOTE — Patient Instructions (Addendum)
Same HEP  But borrow pt 2 lbs weight for wrist in all planes

## 2017-08-17 ENCOUNTER — Ambulatory Visit: Payer: No Typology Code available for payment source | Admitting: Occupational Therapy

## 2017-08-17 DIAGNOSIS — L905 Scar conditions and fibrosis of skin: Secondary | ICD-10-CM

## 2017-08-17 DIAGNOSIS — M6281 Muscle weakness (generalized): Secondary | ICD-10-CM

## 2017-08-17 DIAGNOSIS — M25642 Stiffness of left hand, not elsewhere classified: Secondary | ICD-10-CM

## 2017-08-17 DIAGNOSIS — M25632 Stiffness of left wrist, not elsewhere classified: Secondary | ICD-10-CM

## 2017-08-17 DIAGNOSIS — R600 Localized edema: Secondary | ICD-10-CM

## 2017-08-17 DIAGNOSIS — M25532 Pain in left wrist: Secondary | ICD-10-CM
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
## 2017-08-17 NOTE — Patient Instructions (Signed)
Same but end range for putty gripping  And not over do supination - - pain should be less than 2/10

## 2017-08-22 ENCOUNTER — Ambulatory Visit: Payer: No Typology Code available for payment source | Admitting: Occupational Therapy

## 2017-08-24 ENCOUNTER — Ambulatory Visit: Payer: No Typology Code available for payment source | Admitting: Occupational Therapy

## 2017-08-24 DIAGNOSIS — M25642 Stiffness of left hand, not elsewhere classified: Secondary | ICD-10-CM

## 2017-08-24 DIAGNOSIS — M25532 Pain in left wrist: Secondary | ICD-10-CM

## 2017-08-24 DIAGNOSIS — M25632 Stiffness of left wrist, not elsewhere classified: Secondary | ICD-10-CM

## 2017-08-24 DIAGNOSIS — R600 Localized edema: Secondary | ICD-10-CM

## 2017-08-24 DIAGNOSIS — M6281 Muscle weakness (generalized): Secondary | ICD-10-CM

## 2017-08-24 DIAGNOSIS — L905 Scar conditions and fibrosis of skin: Secondary | ICD-10-CM
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
## 2017-08-24 NOTE — Patient Instructions (Signed)
Pt to use 2 lbs weight for stretch for wrist flexion , sup and pronation   wrist extention table slides   Putty teal for grip , cylinder grip and pulling with ulnar side of hand  Cont increase functional use

## 2017-08-31 ENCOUNTER — Ambulatory Visit: Payer: No Typology Code available for payment source | Attending: Student | Admitting: Occupational Therapy

## 2017-08-31 DIAGNOSIS — M25642 Stiffness of left hand, not elsewhere classified: Secondary | ICD-10-CM

## 2017-08-31 DIAGNOSIS — M25532 Pain in left wrist: Secondary | ICD-10-CM

## 2017-08-31 DIAGNOSIS — L905 Scar conditions and fibrosis of skin: Secondary | ICD-10-CM

## 2017-08-31 DIAGNOSIS — R600 Localized edema: Secondary | ICD-10-CM | POA: Insufficient documentation

## 2017-08-31 DIAGNOSIS — M6281 Muscle weakness (generalized): Secondary | ICD-10-CM

## 2017-08-31 DIAGNOSIS — M25632 Stiffness of left wrist, not elsewhere classified: Secondary | ICD-10-CM | POA: Insufficient documentation
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
## 2017-08-31 NOTE — Patient Instructions (Signed)
Cont wrist extention stretch but focus on flexion stretch and pronation   And then cont 2 lbs weight   but increase use gradually - cutting hair   And then putty cont full fist and cylinder fist into putty   Add kinesiotape over volar scar - ed on applying and precautions

## 2017-09-02 ENCOUNTER — Ambulatory Visit: Payer: No Typology Code available for payment source | Admitting: Occupational Therapy

## 2017-09-09 ENCOUNTER — Ambulatory Visit: Payer: No Typology Code available for payment source | Admitting: Occupational Therapy

## 2017-09-09 DIAGNOSIS — M25642 Stiffness of left hand, not elsewhere classified: Secondary | ICD-10-CM

## 2017-09-09 DIAGNOSIS — M25532 Pain in left wrist: Secondary | ICD-10-CM

## 2017-09-09 DIAGNOSIS — M6281 Muscle weakness (generalized): Secondary | ICD-10-CM

## 2017-09-09 DIAGNOSIS — R600 Localized edema: Secondary | ICD-10-CM

## 2017-09-09 DIAGNOSIS — M25632 Stiffness of left wrist, not elsewhere classified: Secondary | ICD-10-CM

## 2017-09-09 DIAGNOSIS — L905 Scar conditions and fibrosis of skin: Secondary | ICD-10-CM
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
## 2017-09-09 NOTE — Patient Instructions (Signed)
Focus on L pronation , flexion and extnetion stretches   30 sec at time 5 x  3 x day  And then functional use  Green putty for lat and 3 point grip   12 reps   x 2  1 x day   And  Then hand gripper for 10 lbs - 10 reps   x 2 and hold 5 sec   5 reps   not over do - pain free
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
## 2019-11-22 IMAGING — DX DG WRIST COMPLETE 3+V*L*
3 series · 3 of 3 positions shown · non-contrast
Comparison: 05/06/2017

CLINICAL DATA: Postreduction

EXAM:
LEFT WRIST - COMPLETE 3+ VIEW

[wrist ap]
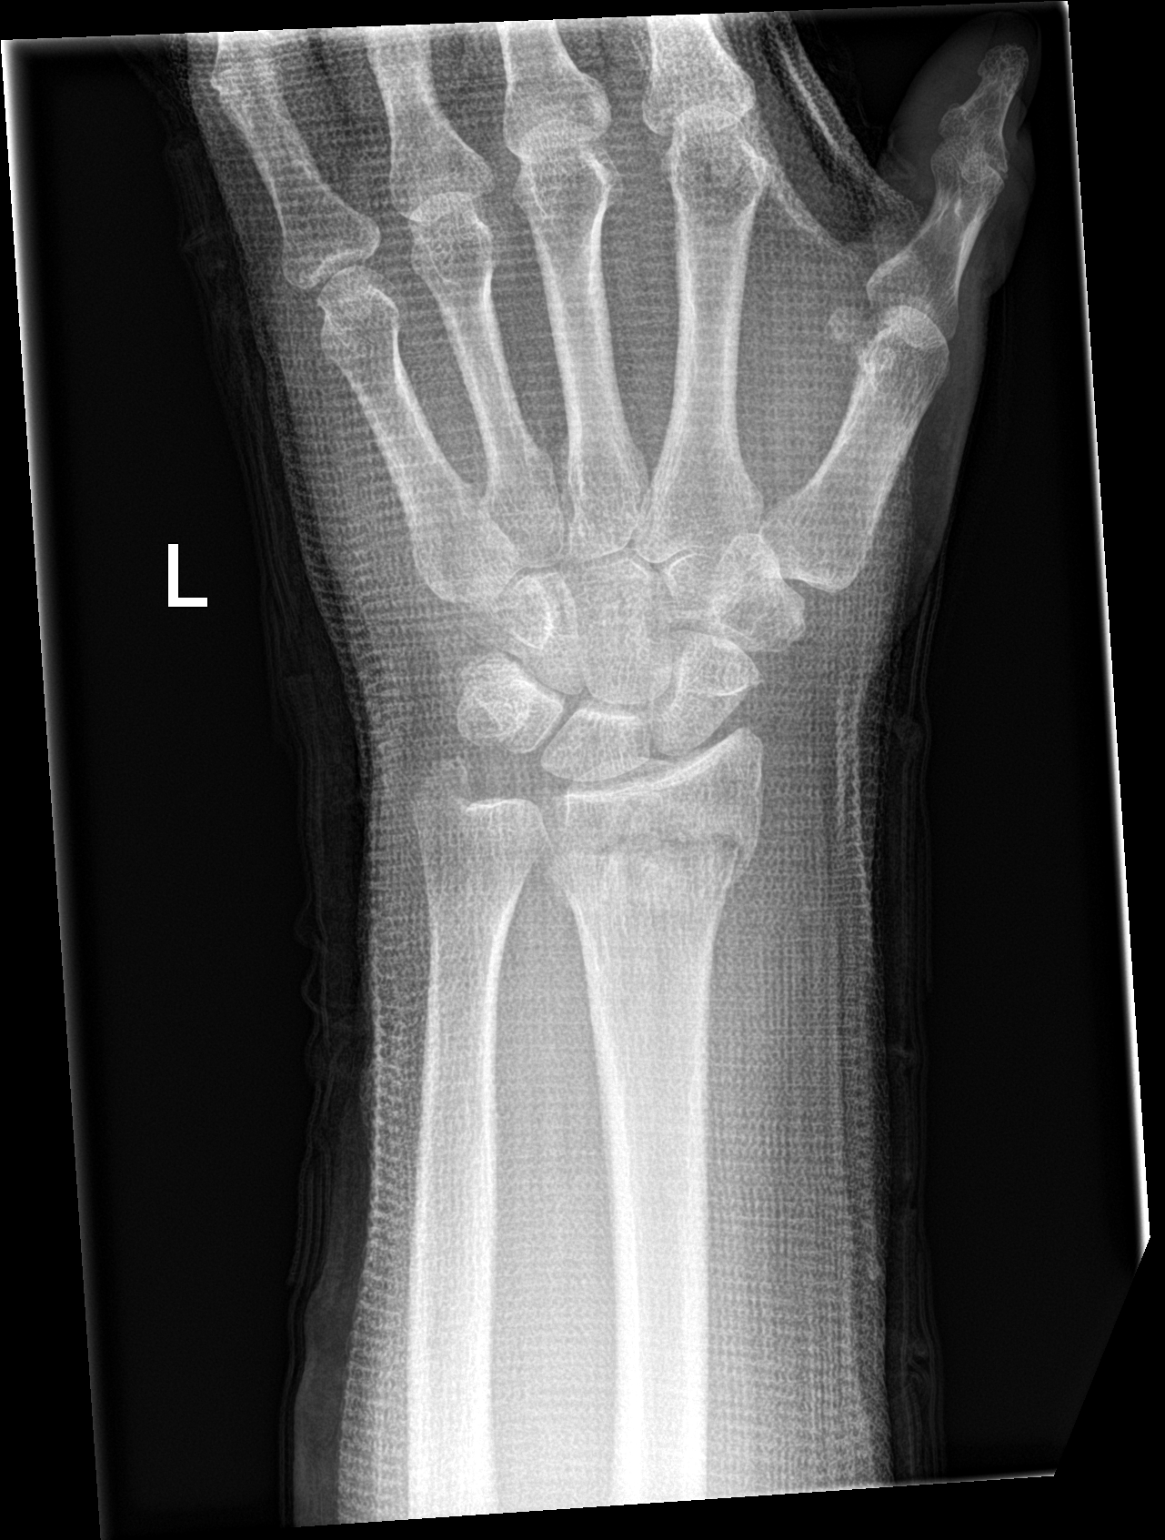

[wrist obl]
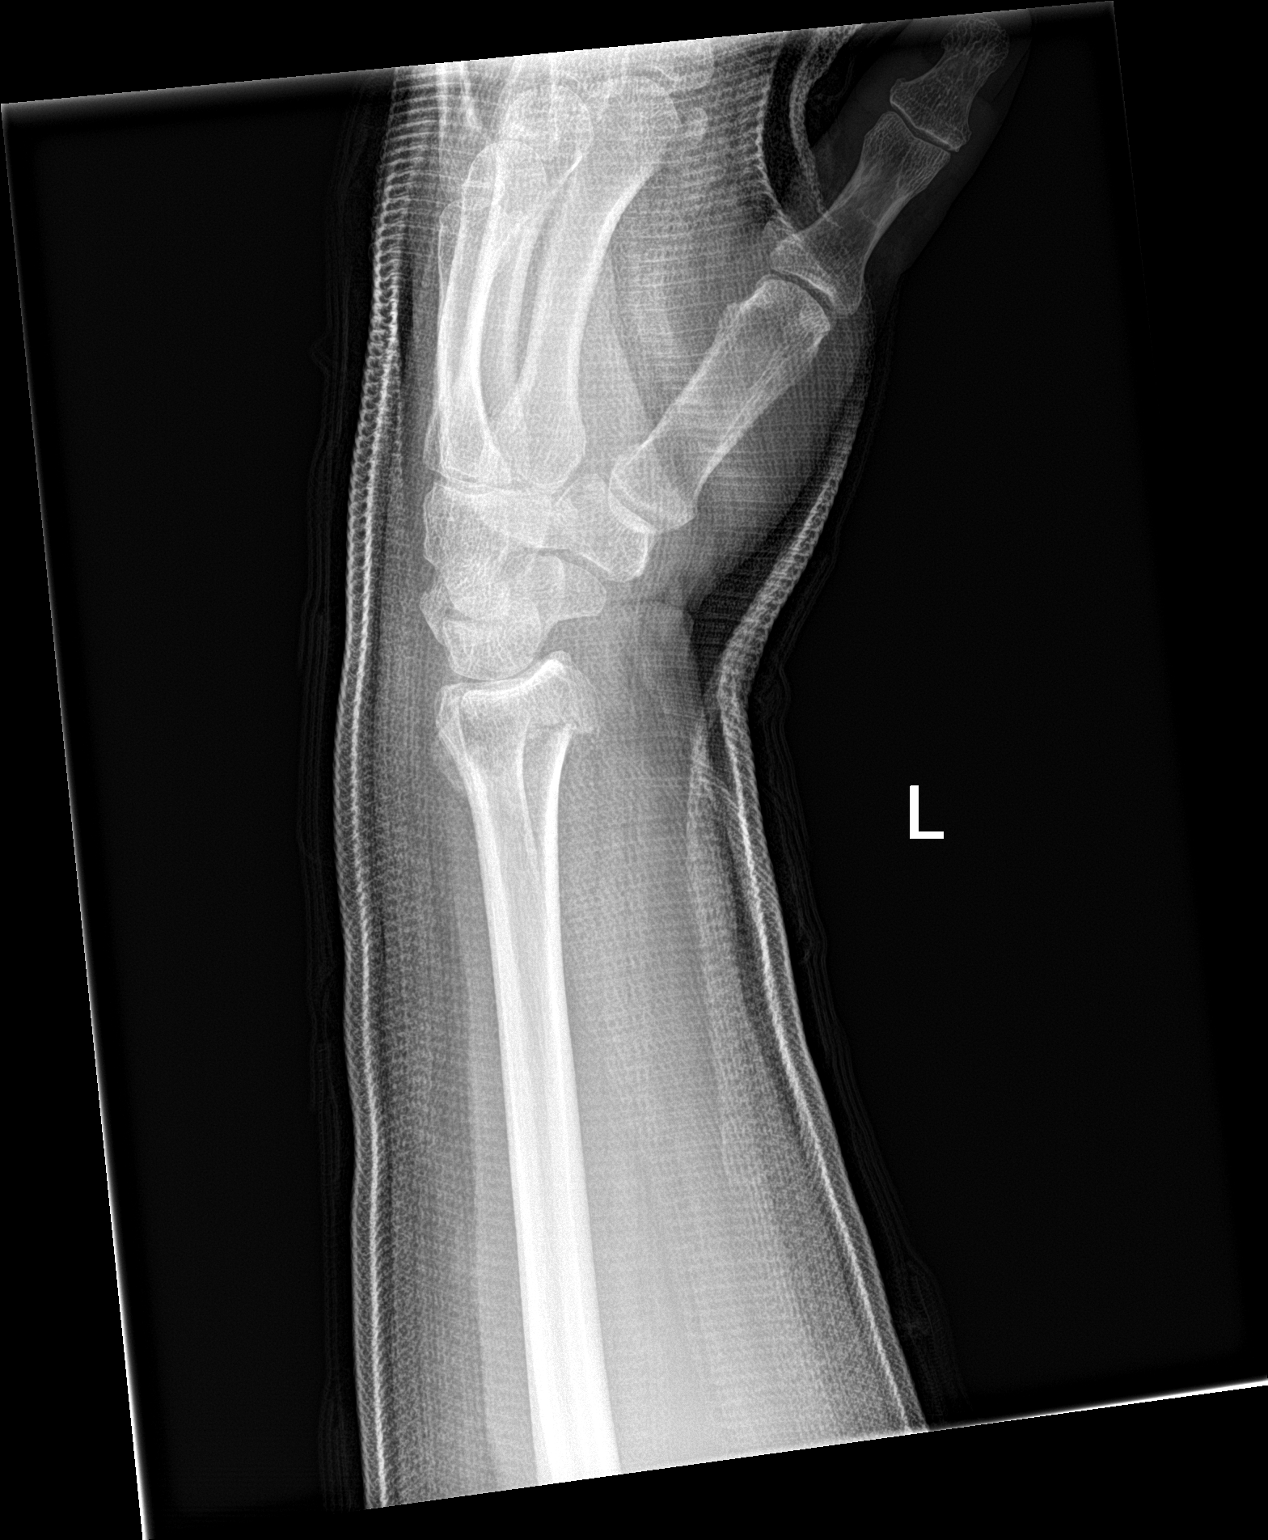

[wrist lat]
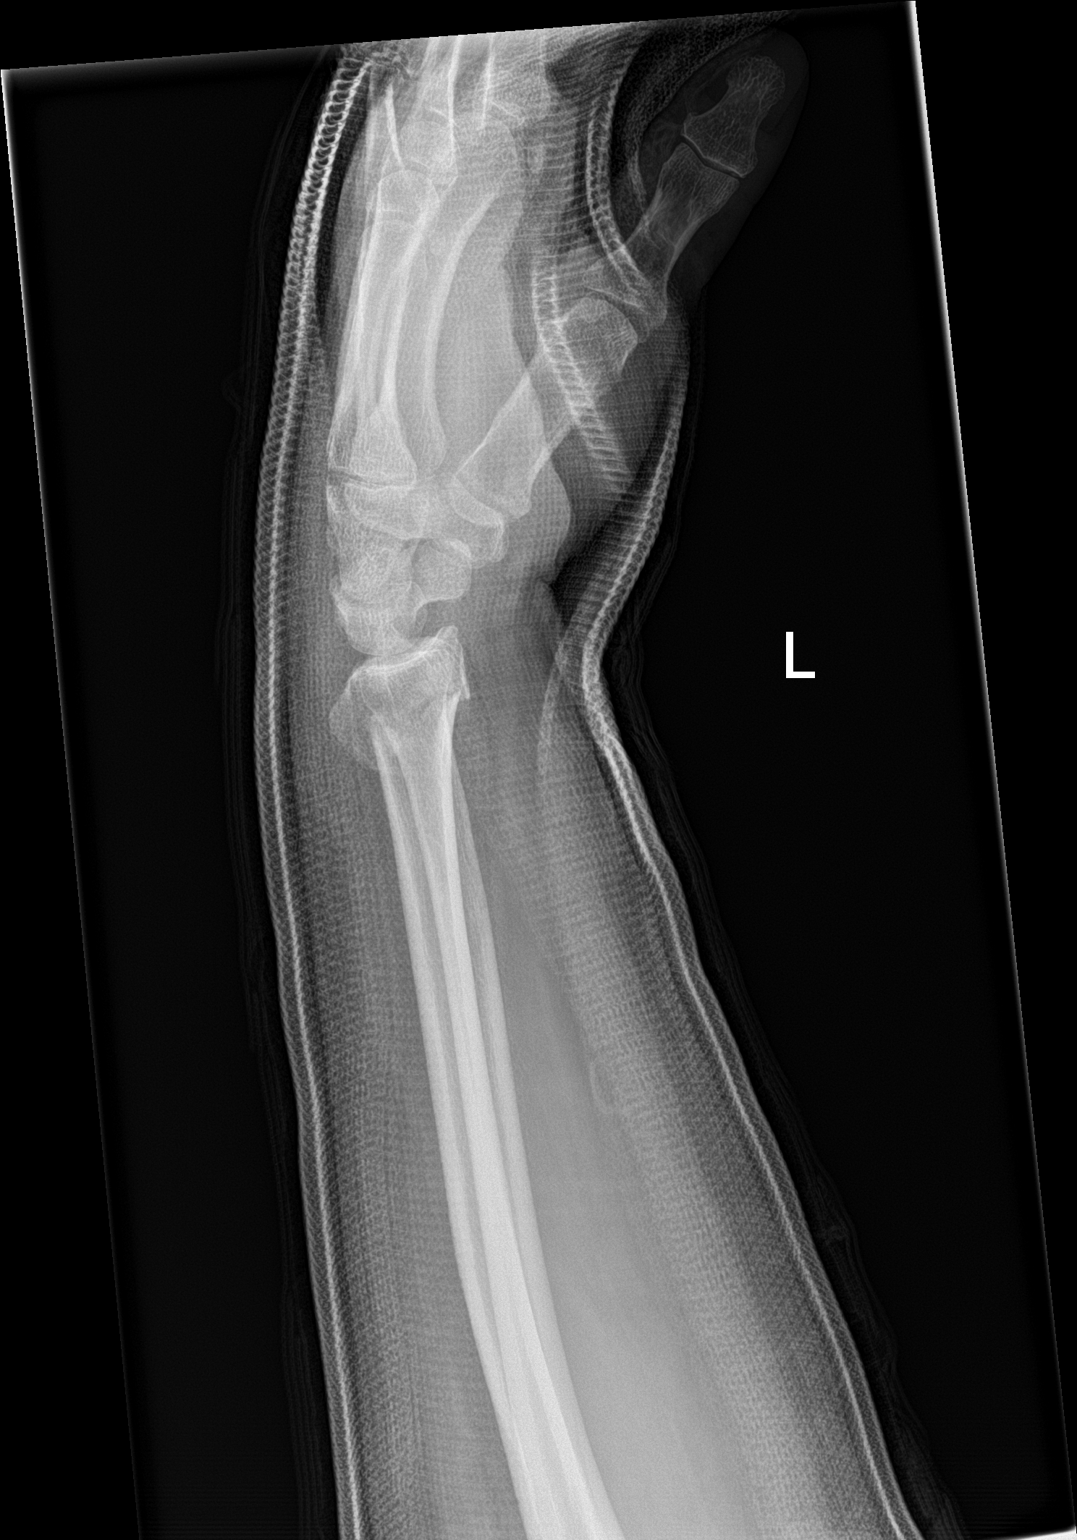

[3 of 3 positions shown; findings below may reference images not displayed]

FINDINGS: Interval placement of splint material which obscures bone detail.
Fractures of the distal left radius and ulnar styloid process.
Improved alignment and position of radial metaphyseal fractures with
mild impaction of fracture fragments.
IMPRESSION: Improved alignment and position of distal radial fracture post
casting. Ulnar styloid process fracture.
